# Patient Record
Sex: Female | Born: 1965 | Race: White | Hispanic: No | Marital: Married
Health system: Southern US, Community
[De-identification: ages and names within clinical notes are randomized; demographics above are authoritative.]

---

## 1997-06-11 ENCOUNTER — Inpatient Hospital Stay (HOSPITAL_COMMUNITY): Admission: AD | Admit: 1997-06-11 | Discharge: 1997-06-12 | Payer: Self-pay | Admitting: Obstetrics & Gynecology

## 1997-06-29 ENCOUNTER — Inpatient Hospital Stay (HOSPITAL_COMMUNITY): Admission: AD | Admit: 1997-06-29 | Discharge: 1997-07-01 | Payer: Self-pay | Admitting: Obstetrics & Gynecology

## 1997-07-08 ENCOUNTER — Encounter: Admission: RE | Admit: 1997-07-08 | Discharge: 1997-10-06 | Payer: Self-pay | Admitting: Obstetrics and Gynecology

## 1997-08-16 ENCOUNTER — Inpatient Hospital Stay (HOSPITAL_COMMUNITY): Admission: AD | Admit: 1997-08-16 | Discharge: 1997-08-21 | Payer: Self-pay | Admitting: Obstetrics and Gynecology

## 1997-08-21 ENCOUNTER — Encounter: Admission: RE | Admit: 1997-08-21 | Discharge: 1997-11-19 | Payer: Self-pay | Admitting: Obstetrics and Gynecology

## 1997-11-20 ENCOUNTER — Encounter (HOSPITAL_COMMUNITY): Admission: RE | Admit: 1997-11-20 | Discharge: 1997-12-14 | Payer: Self-pay | Admitting: *Deleted

## 1998-04-09 ENCOUNTER — Other Ambulatory Visit: Admission: RE | Admit: 1998-04-09 | Discharge: 1998-04-09 | Payer: Self-pay | Admitting: Obstetrics and Gynecology

## 1998-05-28 ENCOUNTER — Ambulatory Visit (HOSPITAL_COMMUNITY): Admission: RE | Admit: 1998-05-28 | Discharge: 1998-05-28 | Payer: Self-pay | Admitting: Obstetrics and Gynecology

## 1999-04-18 ENCOUNTER — Other Ambulatory Visit: Admission: RE | Admit: 1999-04-18 | Discharge: 1999-04-18 | Payer: Self-pay | Admitting: Obstetrics and Gynecology

## 2000-04-03 ENCOUNTER — Other Ambulatory Visit: Admission: RE | Admit: 2000-04-03 | Discharge: 2000-04-03 | Payer: Self-pay | Admitting: Obstetrics and Gynecology

## 2000-06-21 ENCOUNTER — Encounter (INDEPENDENT_AMBULATORY_CARE_PROVIDER_SITE_OTHER): Payer: Self-pay | Admitting: Specialist

## 2000-06-21 ENCOUNTER — Other Ambulatory Visit: Admission: RE | Admit: 2000-06-21 | Discharge: 2000-06-21 | Payer: Self-pay | Admitting: Obstetrics and Gynecology

## 2001-06-28 ENCOUNTER — Other Ambulatory Visit: Admission: RE | Admit: 2001-06-28 | Discharge: 2001-06-28 | Payer: Self-pay | Admitting: Obstetrics and Gynecology

## 2002-06-30 ENCOUNTER — Other Ambulatory Visit: Admission: RE | Admit: 2002-06-30 | Discharge: 2002-06-30 | Payer: Self-pay | Admitting: Obstetrics and Gynecology

## 2002-12-29 ENCOUNTER — Inpatient Hospital Stay (HOSPITAL_COMMUNITY): Admission: AD | Admit: 2002-12-29 | Discharge: 2003-01-01 | Payer: Self-pay | Admitting: Obstetrics and Gynecology

## 2003-02-03 ENCOUNTER — Other Ambulatory Visit: Admission: RE | Admit: 2003-02-03 | Discharge: 2003-02-03 | Payer: Self-pay | Admitting: Obstetrics and Gynecology

## 2005-02-27 ENCOUNTER — Other Ambulatory Visit: Admission: RE | Admit: 2005-02-27 | Discharge: 2005-02-27 | Payer: Self-pay | Admitting: Obstetrics and Gynecology

## 2008-07-11 ENCOUNTER — Encounter: Payer: Self-pay | Admitting: Emergency Medicine

## 2008-07-11 ENCOUNTER — Observation Stay (HOSPITAL_COMMUNITY): Admission: EM | Admit: 2008-07-11 | Discharge: 2008-07-12 | Payer: Self-pay | Admitting: Orthopedic Surgery

## 2008-08-24 ENCOUNTER — Ambulatory Visit: Payer: Self-pay | Admitting: Vascular Surgery

## 2008-08-24 ENCOUNTER — Ambulatory Visit: Admission: RE | Admit: 2008-08-24 | Discharge: 2008-08-24 | Payer: Self-pay | Admitting: Orthopedic Surgery

## 2008-08-24 ENCOUNTER — Encounter (INDEPENDENT_AMBULATORY_CARE_PROVIDER_SITE_OTHER): Payer: Self-pay | Admitting: Orthopedic Surgery

## 2010-08-11 LAB — COMPREHENSIVE METABOLIC PANEL
Alkaline Phosphatase: 114 U/L (ref 39–117)
BUN: 16 mg/dL (ref 6–23)
Chloride: 107 mEq/L (ref 96–112)
Creatinine, Ser: 0.78 mg/dL (ref 0.4–1.2)
Glucose, Bld: 107 mg/dL — ABNORMAL HIGH (ref 70–99)
Potassium: 3.5 mEq/L (ref 3.5–5.1)
Total Bilirubin: 0.7 mg/dL (ref 0.3–1.2)

## 2010-08-11 LAB — DIFFERENTIAL
Basophils Absolute: 0 10*3/uL (ref 0.0–0.1)
Basophils Relative: 0 % (ref 0–1)
Lymphocytes Relative: 20 % (ref 12–46)
Neutro Abs: 5.4 10*3/uL (ref 1.7–7.7)
Neutrophils Relative %: 74 % (ref 43–77)

## 2010-08-11 LAB — CBC
HCT: 38 % (ref 36.0–46.0)
Hemoglobin: 13 g/dL (ref 12.0–15.0)
MCV: 89.8 fL (ref 78.0–100.0)
RDW: 11.9 % (ref 11.5–15.5)
WBC: 7.3 10*3/uL (ref 4.0–10.5)

## 2010-09-13 NOTE — Op Note (Signed)
NAMESHARMIN, FOULK                    ACCOUNT NO.:  [id number]   MEDICAL RECORD NO.:  [id number]          PATIENT TYPE:  INP   LOCATION:  5033                         FACILITY:  [facility]   PHYSICIAN:  Nadara Mustard, MD     DATE OF BIRTH:  09-Oct-1965   DATE OF PROCEDURE:  DATE OF DISCHARGE:                               OPERATIVE REPORT   PREOPERATIVE DIAGNOSIS:  Closed spiral right tib-fib fracture.   POSTOPERATIVE DIAGNOSIS:  Closed spiral right tib-fib fracture.   PROCEDURE:  Intramedullary nailing of right tibia with a Synthes 10 x  340 mm nail, with a 40 mm proximal interlocking screw.   SURGEON:  Nadara Mustard, MD   ANESTHESIA:  General.   ESTIMATED BLOOD LOSS:  Minimal.   ANTIBIOTICS:  1 g of Kefzol.   DRAINS:  None.   COMPLICATIONS:  None.   TOURNIQUET TIME:  None.   DISPOSITION:  To PACU in stable condition.   INDICATIONS FOR PROCEDURE:  The patient is a 45 year old woman who fell  while road skating sustaining a spiral fracture of the tibia at the  junction of the middle and distal third.  She also had a proximal  fracture of the fibula.  She was initially brought to [facility]  Emergency Room.  She was neurovascularly intact with obvious deformity  to her leg.  Radiographs confirmed the tib-fib fracture.  [place]  operating room state that they were not available for surgery and the  patient was brought to [facility] for definitive treatment for the  closed tib-fib fracture.  Risks and benefits were discussed with the  patient and her husband including infection, neurovascular injury,  persistent pain, DVT, pulmonary embolus, need for additional surgery.  The patient states she understands and wished to proceed at this time.   DESCRIPTION OF PROCEDURE:  The patient was brought to OR, room 15, and  underwent a general anesthetic.  After adequate level of anesthesia  obtained, the patient's right lower extremity was prepped using DuraPrep  and draped into a  sterile field.  A medial parapatellar incision was  made approximately 2 cm in length.  A blunt dissection was carried down  to the tibial plateau.  A guidewire was inserted into the starting point  of the tibial plateau and this was verified by C-arm fluoroscopy in both  AP and lateral plains.  The guidewire was advanced and this was  overdrilled with a reamer with a tissue protector in place.  The  guidewire was then inserted with the fracture reduced.  The guidewire  was inserted across the fracture site.  This was then sequentially  reamed from 8.5 to 11.5 mm for a 10 mm nail.  The 10 mm nail was then  advanced across the guidewire with the fracture reduced.  C-arm  fluoroscopy verified reduction.  The spiral fracture lock rotationally  in place and the distal interlocking screw was not used.  Proximal  interlocking guide was used and a 40-mm proximal interlocking screw was  placed.  The instruments were removed.  The C-arm  fluoroscopy verified  reduction in both AP and lateral plains.  The subcu was closed using 2-0  Vicryl and skin was closed using Proximate staples.  The wound was  covered with Adaptic orthopedic sponges, sterile Kerlix, and Coban.  The  patient was extubated and taken to the PACU in stable condition.      Nadara Mustard, MD  Electronically Signed     MVD/MEDQ  D:  07/11/2008  T:  07/12/2008  Job:  [id number]

## 2010-11-23 ENCOUNTER — Encounter (HOSPITAL_COMMUNITY)
Admission: RE | Admit: 2010-11-23 | Discharge: 2010-11-23 | Disposition: A | Discharge status: Home / Self Care | Payer: BC Managed Care – PPO | Source: Ambulatory Visit | Attending: Orthopedic Surgery | Admitting: Orthopedic Surgery

## 2010-11-23 LAB — CBC
Hemoglobin: 14 g/dL (ref 12.0–15.0)
MCHC: 35 g/dL (ref 30.0–36.0)
RBC: 4.63 MIL/uL (ref 3.87–5.11)
WBC: 6.2 10*3/uL (ref 4.0–10.5)

## 2010-11-23 LAB — SURGICAL PCR SCREEN
MRSA, PCR: NEGATIVE
Staphylococcus aureus: NEGATIVE

## 2010-11-23 LAB — HCG, SERUM, QUALITATIVE: Preg, Serum: NEGATIVE

## 2010-11-23 LAB — ABO/RH: ABO/RH(D): AB POS

## 2010-11-24 LAB — TYPE AND SCREEN
ABO/RH(D): AB POS
Antibody Screen: NEGATIVE

## 2010-11-28 ENCOUNTER — Ambulatory Visit (HOSPITAL_COMMUNITY)
Admission: RE | Admit: 2010-11-28 | Discharge: 2010-11-28 | Disposition: A | Discharge status: Home / Self Care | Payer: BC Managed Care – PPO | Source: Ambulatory Visit | Attending: Orthopedic Surgery | Admitting: Orthopedic Surgery

## 2010-11-28 ENCOUNTER — Ambulatory Visit (HOSPITAL_COMMUNITY): Payer: BC Managed Care – PPO

## 2010-11-28 DIAGNOSIS — Z01812 Encounter for preprocedural laboratory examination: Secondary | ICD-10-CM | POA: Insufficient documentation

## 2010-11-28 DIAGNOSIS — Z472 Encounter for removal of internal fixation device: Secondary | ICD-10-CM | POA: Insufficient documentation

## 2010-11-28 DIAGNOSIS — M8560 Other cyst of bone, unspecified site: Secondary | ICD-10-CM | POA: Insufficient documentation

## 2010-12-16 NOTE — Op Note (Addendum)
  Jennifer Liu, Jennifer Liu                    ACCOUNT NO.:  [id number]  MEDICAL RECORD NO.:  [id number]  LOCATION:  SDS                          FACILITY:  [facility]  PHYSICIAN:  Mila Homer. Sherlean Foot, M.D. DATE OF BIRTH:  January 14, 1966  DATE OF PROCEDURE:  11/28/2010 DATE OF DISCHARGE:  11/23/2010                              OPERATIVE REPORT   SURGEON:  Mila Homer. Sherlean Foot, MD  ASSISTANT:  Altamese Cabal, PA-C  ANESTHESIA:  General.  PREOPERATIVE DIAGNOSIS:  Painful right tibial nail.  POSTOPERATIVE DIAGNOSIS:  Painful right tibial nail.  PROCEDURE:  Attempted right IM nail removal and bone grafting of open patellar cyst.  INDICATIONS FOR PROCEDURE:  The patient is over a year out from intramedullary nailing and dynamization of the nail with complete healing but continued pain in the tibia to failure conservative measures.  Informed consent was obtained.  DESCRIPTION OF PROCEDURE:  The patient was laid supine and administered general anesthesia.  Right leg prepped and draped in usual sterile fashion.  The extremity was exsanguinated with the Esmarch and tourniquet inflated to 350 mmHg.  I then made the incision through the old incision with a #10 blade and extended down to a centimeter distally to obtain excellent exposure.  I went down through the knee capsule, removed some of the fat that become an entry point of the tibia without a problem.  I then rongeured out the hole, there was a cyst measuring approximately 3 x 3 cm in the capsule area.  I felt that this could have been pathologic and could have been the cause of some of the patient's pains.  The nail was quite deep in the tibia.  I did clear out the hole and threads and attached the extraction device.  I attempted to extract it  and could not move it at all.  I felt that there was probably bone bridging across the screw holes.  After several attempts, I decided to abort that for fear of causing damage to bone.  I then used 20 mL  of crushed cancellous graft and grafted the tibial cyst.  I then irrigating closed with buried 0 Vicryl sutures and Monocryl.  I then dressed with Xeroform dressing, sponges, sterile Webril and Ace wrap.  COMPLICATIONS:  None.  DRAINS:  None.  ESTIMATED BLOOD LOSS:  Minimal.          ______________________________ Mila Homer. Sherlean Foot, M.D.     SDL/MEDQ  D:  11/30/2010  T:  11/30/2010  Job:  [id number]  Electronically Signed by Georgena Spurling M.D. on 12/16/2010 02:38:58 PM

## 2018-09-20 ENCOUNTER — Other Ambulatory Visit: Payer: Self-pay | Admitting: Obstetrics and Gynecology

## 2018-09-20 DIAGNOSIS — R928 Other abnormal and inconclusive findings on diagnostic imaging of breast: Secondary | ICD-10-CM

## 2018-09-30 ENCOUNTER — Other Ambulatory Visit: Payer: Self-pay

## 2018-09-30 ENCOUNTER — Ambulatory Visit
Admission: RE | Admit: 2018-09-30 | Discharge: 2018-09-30 | Disposition: A | Discharge status: Home / Self Care | Payer: BC Managed Care – PPO | Source: Ambulatory Visit | Attending: Obstetrics and Gynecology | Admitting: Obstetrics and Gynecology

## 2018-09-30 DIAGNOSIS — R928 Other abnormal and inconclusive findings on diagnostic imaging of breast: Secondary | ICD-10-CM

## 2019-07-17 ENCOUNTER — Ambulatory Visit: Payer: BC Managed Care – PPO | Attending: Internal Medicine

## 2019-07-17 DIAGNOSIS — Z23 Encounter for immunization: Secondary | ICD-10-CM

## 2019-07-17 NOTE — Progress Notes (Signed)
   Covid-19 Vaccination Clinic  Name:  Acsa Estey    MRN: [id number] DOB: November 23, 1965  07/17/2019  Ms. Cozzolino was observed post Covid-19 immunization for 15 minutes without incident. She was provided with Vaccine Information Sheet and instruction to access the V-Safe system.   Ms. Balgobin was instructed to call 911 with any severe reactions post vaccine: Marland Kitchen Difficulty breathing  . Swelling of face and throat  . A fast heartbeat  . A bad rash all over body  . Dizziness and weakness   Immunizations Administered    Name Date Dose VIS Date Route   Pfizer COVID-19 Vaccine 07/17/2019  4:21 PM 0.3 mL 04/11/2019 Intramuscular   Manufacturer: [employer], [employer]   Lot: [id number]   NDC: [address]-0

## 2019-08-13 ENCOUNTER — Ambulatory Visit: Payer: BC Managed Care – PPO | Attending: Internal Medicine

## 2019-08-13 DIAGNOSIS — Z23 Encounter for immunization: Secondary | ICD-10-CM

## 2019-08-13 NOTE — Progress Notes (Signed)
   Covid-19 Vaccination Clinic  Name:  Tamantha Saline    MRN: [id number] DOB: [birth year]  08/13/2019  Ms. Wassenaar was observed post Covid-19 immunization for 15 minutes without incident. She was provided with Vaccine Information Sheet and instruction to access the V-Safe system.   Ms. Rindfleisch was instructed to call 911 with any severe reactions post vaccine: Marland Kitchen Difficulty breathing  . Swelling of face and throat  . A fast heartbeat  . A bad rash all over body  . Dizziness and weakness   Immunizations Administered    Name Date Dose VIS Date Route   Pfizer COVID-19 Vaccine 08/13/2019  3:54 PM 0.3 mL 04/11/2019 Intramuscular   Manufacturer: [employer], [employer]   Lot: [id number]   NDC: [address]-8

## 2020-06-26 IMAGING — MG DIGITAL DIAGNOSTIC UNILATERAL LEFT MAMMOGRAM WITH TOMO AND CAD
4 series · 4 of 12 positions shown · non-contrast
Comparison: Previous exam(s).

CLINICAL DATA: Possible mass in the posterior aspect of the lateral
left breast in the craniocaudal projection of a recent screening
mammogram.

EXAM:
DIGITAL DIAGNOSTIC LEFT MAMMOGRAM WITH CAD AND TOMO
ULTRASOUND LEFT BREAST

[L CC synth-2D]
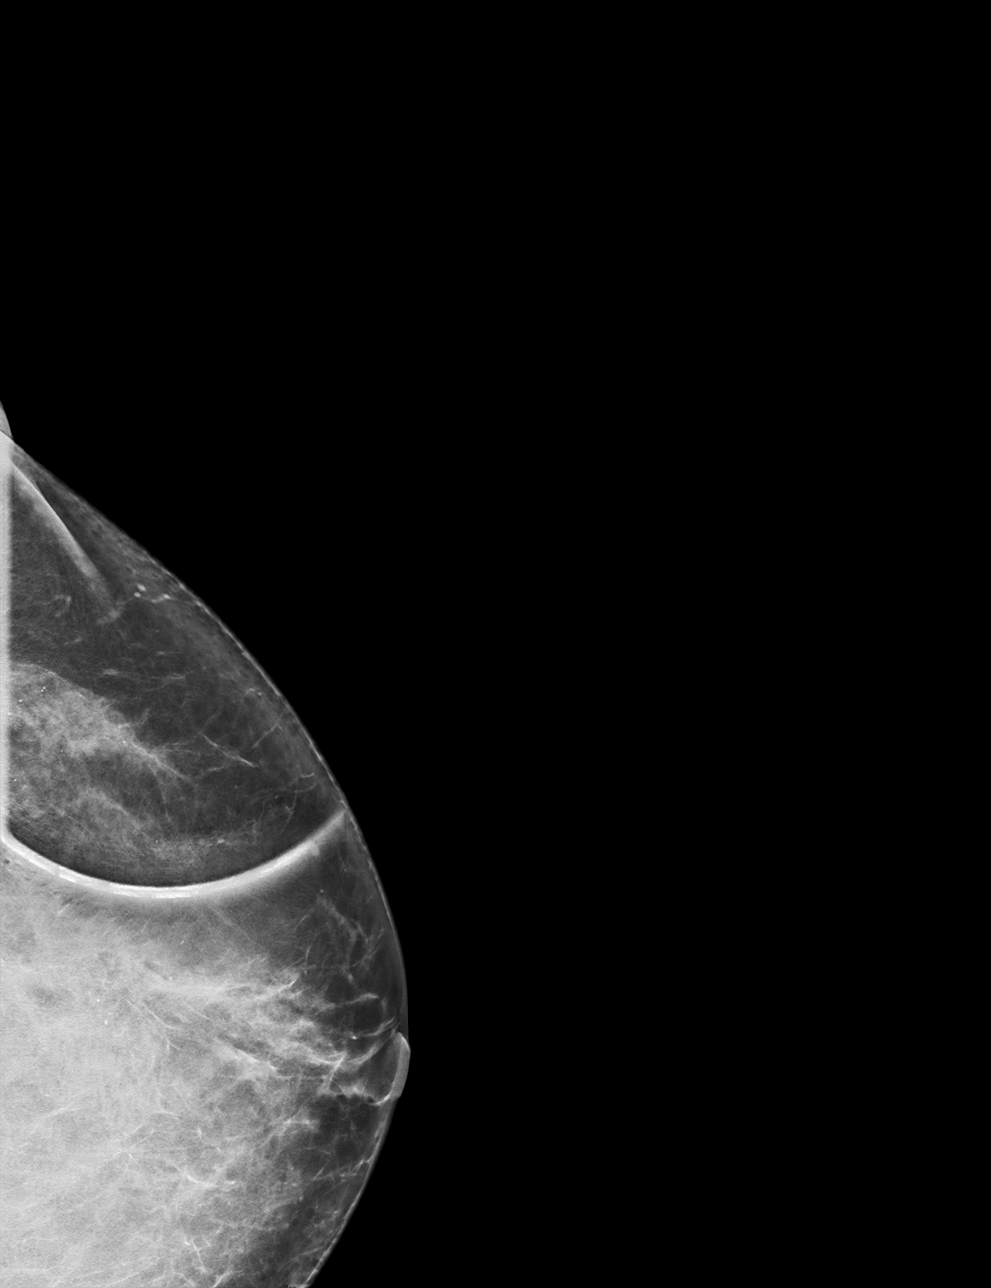

[L ML synth-2D]
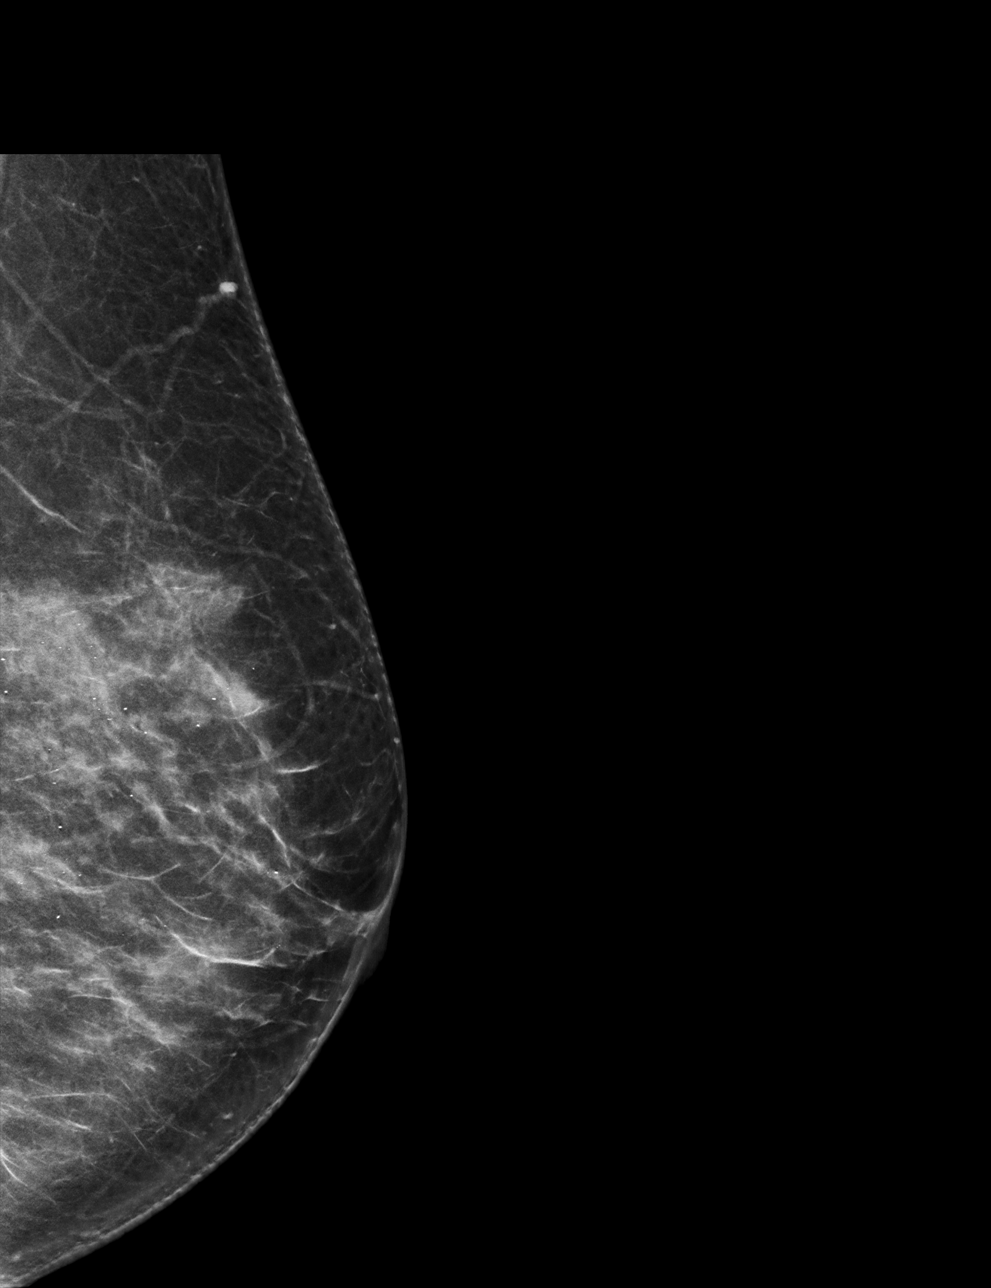

[L ML tomo · tomo slice 36/71.0]
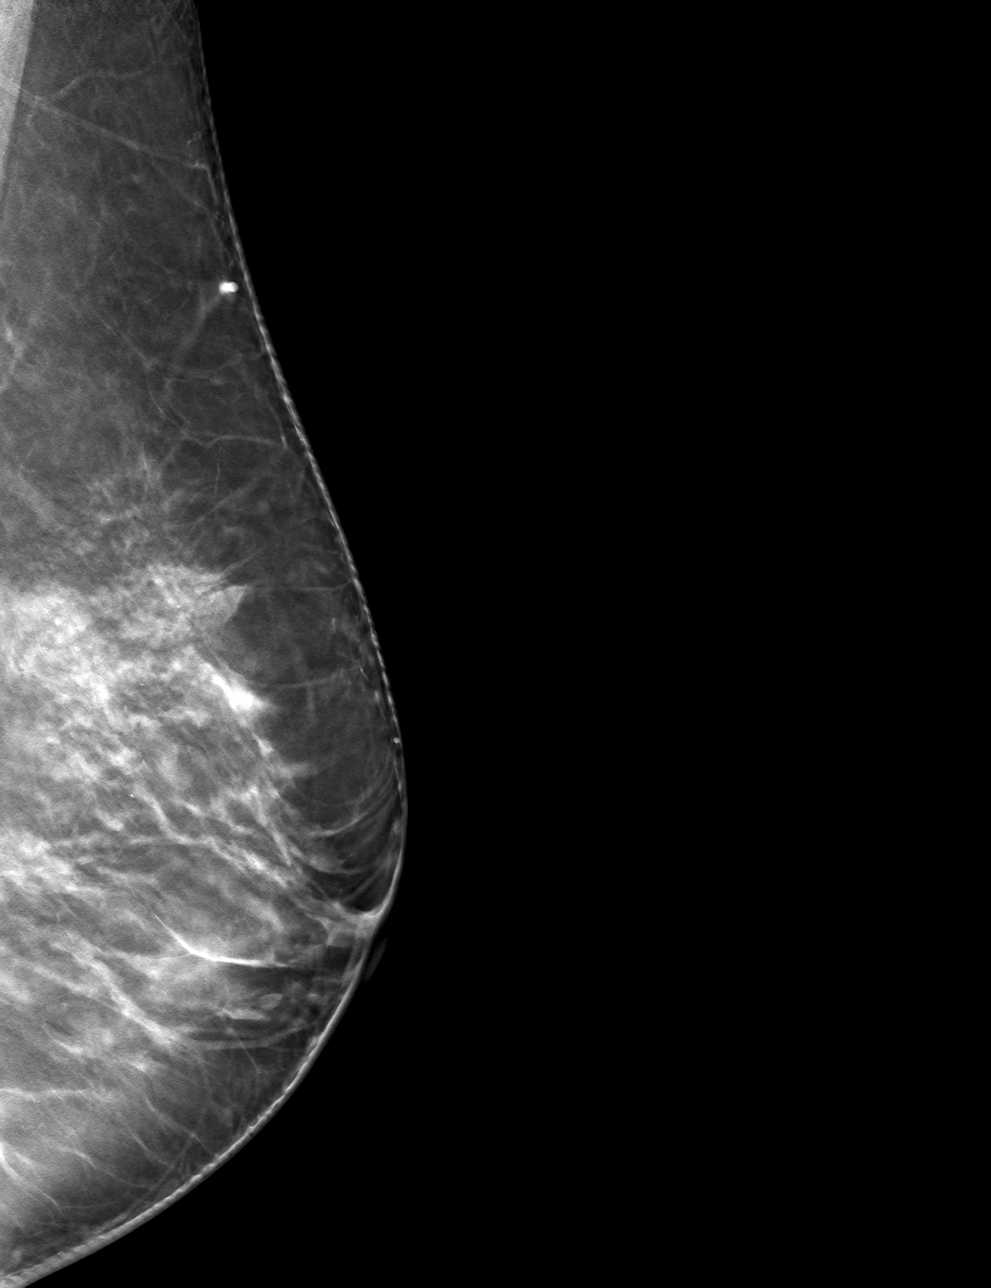

[L CC tomo · tomo slice 33/66.0]
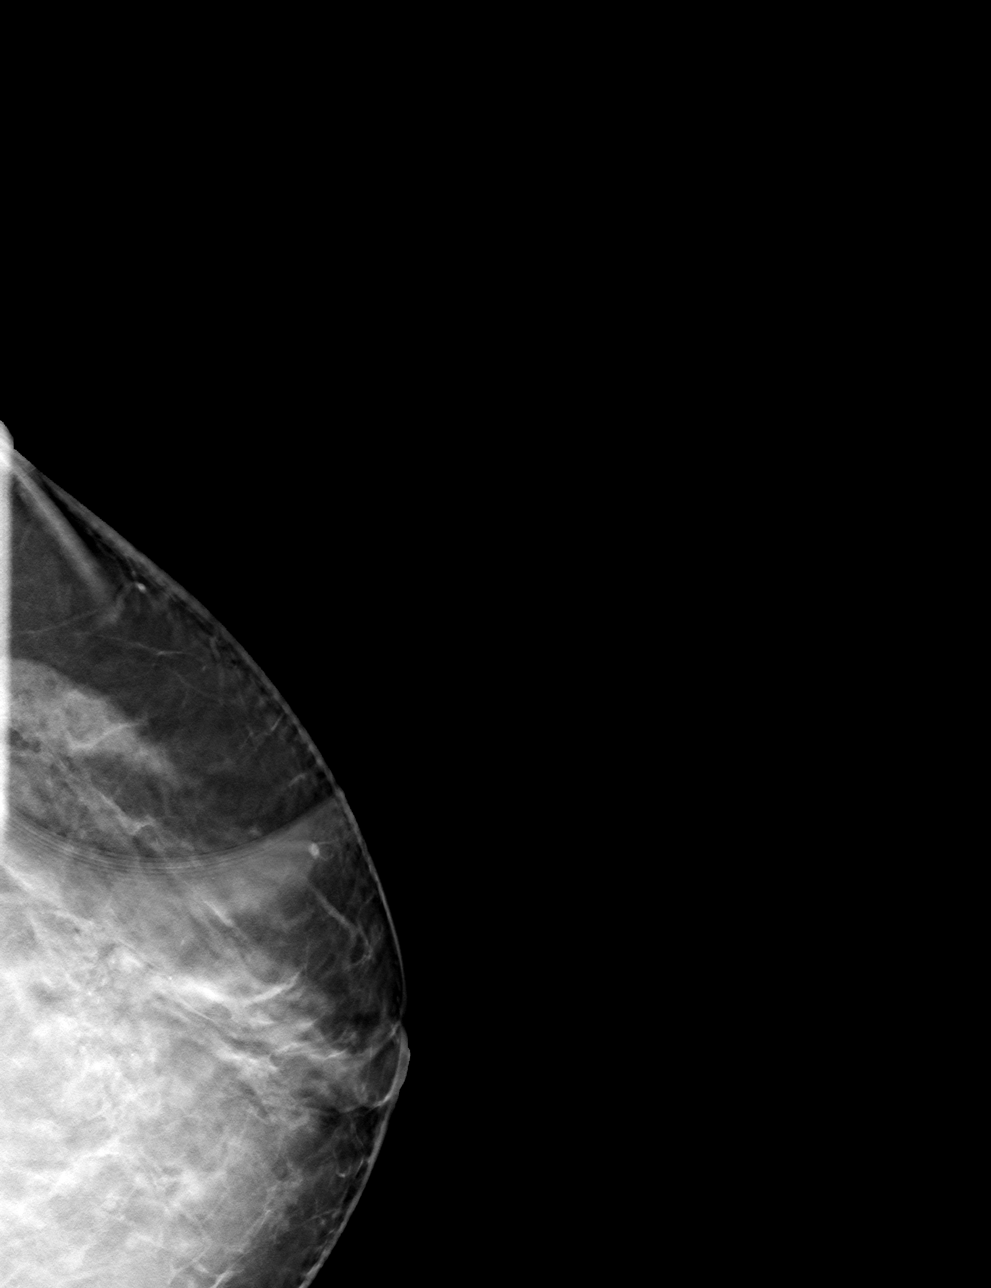

[4 of 12 positions shown; findings below may reference images not displayed]

ACR Breast Density Category c: The breast tissue is heterogeneously
dense, which may obscure small masses.
FINDINGS: 3D tomographic and 2D generated true lateral and spot compression
craniocaudal views of the left breast were obtained. These
demonstrate normal appearing dense glandular tissue at the location
of the recently suspected mass the posterior aspect of the lateral
portion of the breast.

Mammographic images were processed with CAD.

Targeted ultrasound is performed, showing a 1.1 cm cluster of cysts
in the 2 o'clock position of the left breast, 6 cm from the nipple.
This corresponds to the recently suspected mammographic mass.
IMPRESSION: Benign left breast cysts.  No evidence of malignancy.

RECOMMENDATION:
Bilateral screening mammogram in 1 year.

I have discussed the findings and recommendations with the patient.
Results were also provided in writing at the conclusion of the
visit. If applicable, a reminder letter will be sent to the patient
regarding the next appointment.

BI-RADS CATEGORY  2: Benign.

## 2020-06-26 IMAGING — US ULTRASOUND LEFT BREAST LIMITED
1 series · 12 of 12 positions shown · non-contrast
Comparison: Previous exam(s).

CLINICAL DATA: Possible mass in the posterior aspect of the lateral
left breast in the craniocaudal projection of a recent screening
mammogram.

EXAM:
DIGITAL DIAGNOSTIC LEFT MAMMOGRAM WITH CAD AND TOMO
ULTRASOUND LEFT BREAST

[Series 1: ultrasound left breast limited · 0.06mm/px · 12 of 12 slices shown]
[im 1/12]
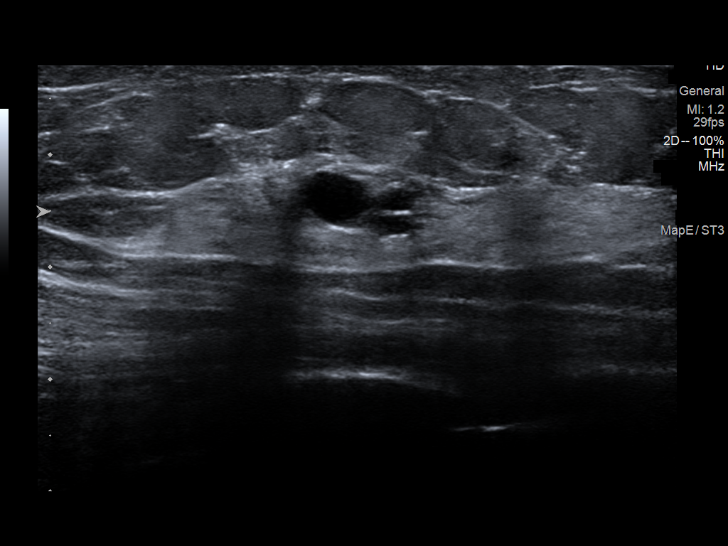
[im 2/12]
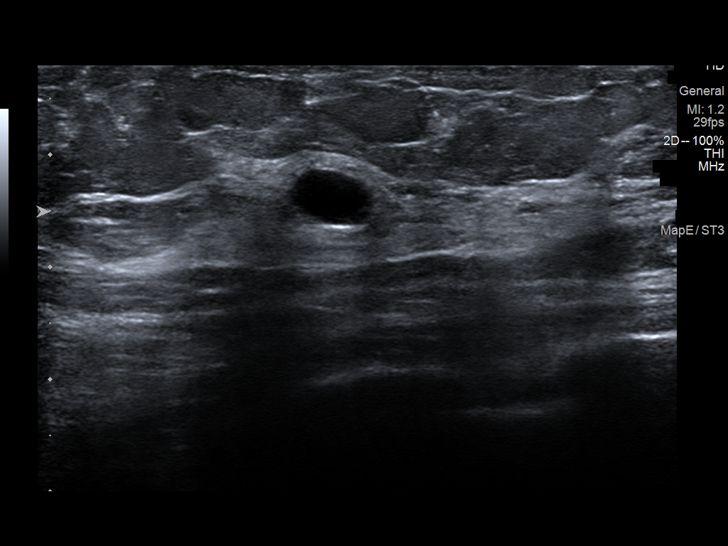
[im 3/12]
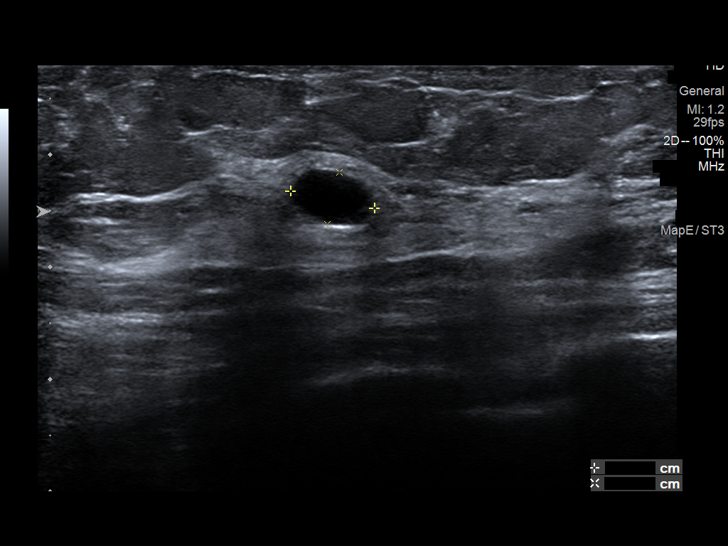
[im 4/12]
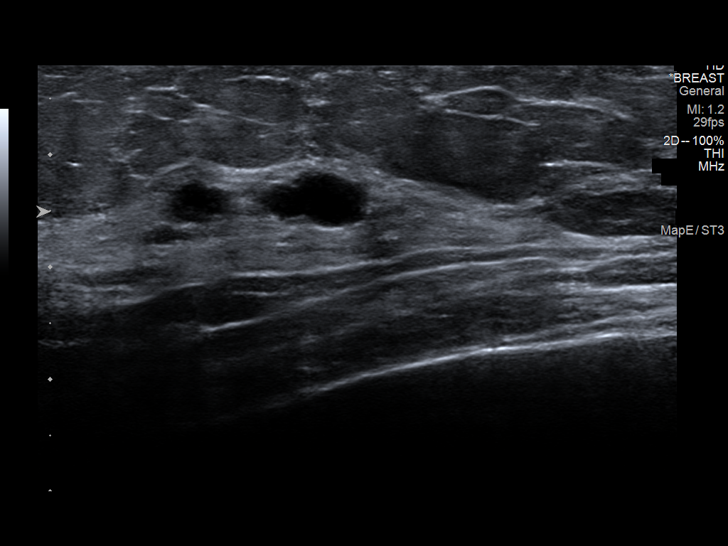
[im 5/12]
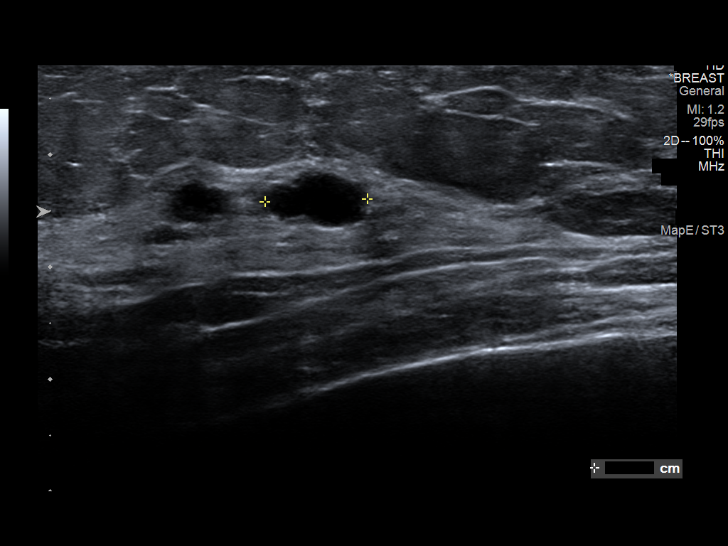
[im 6/12]
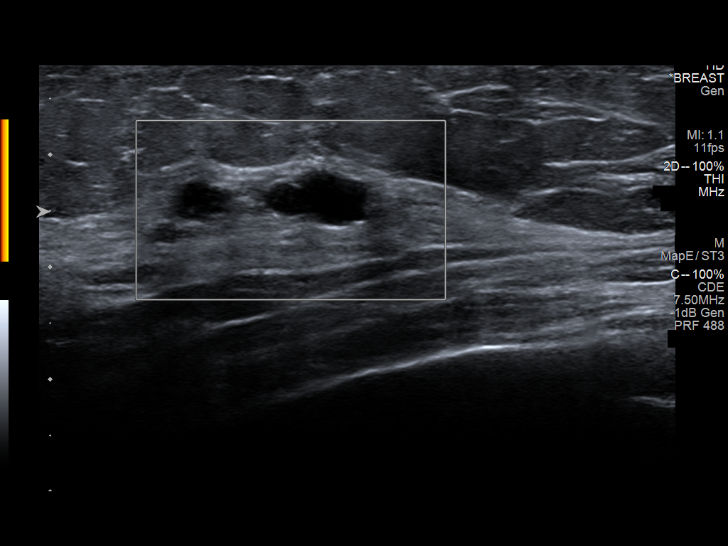
[im 7/12]
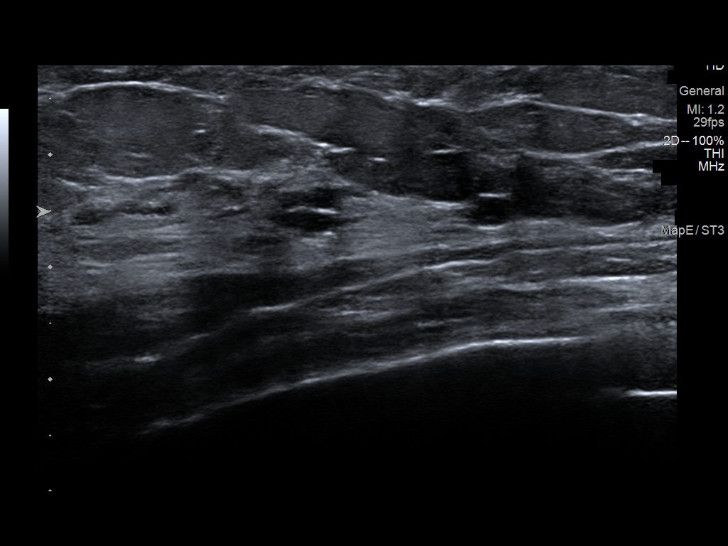
[im 8/12]
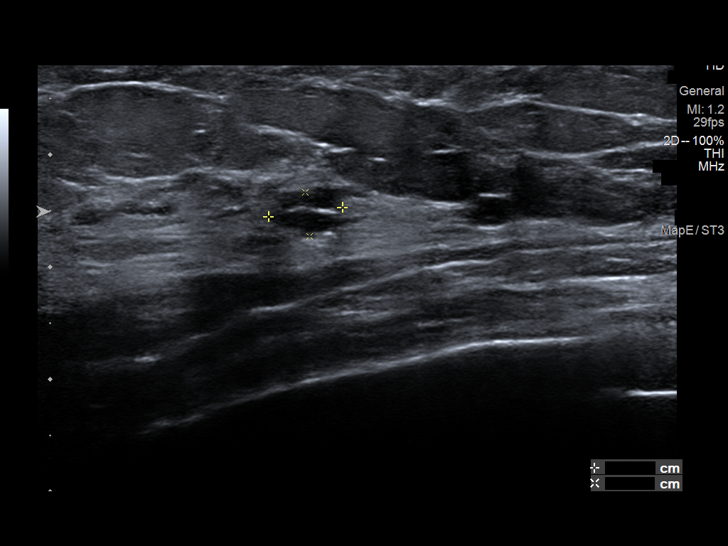
[im 9/12]
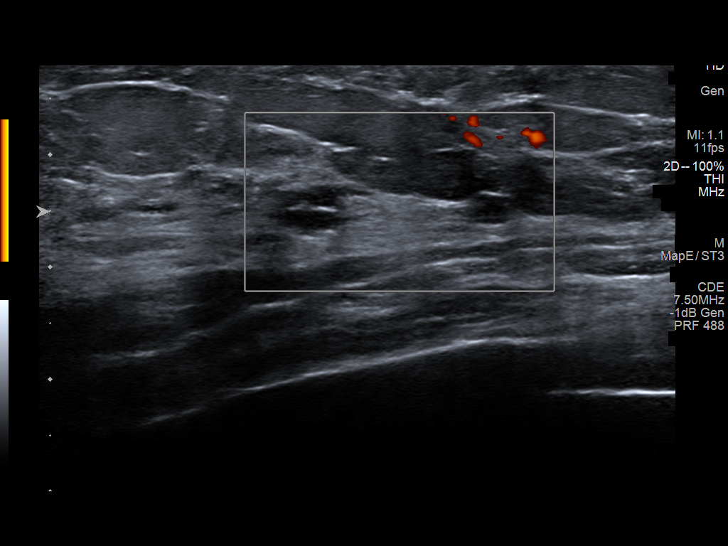
[im 10/12]
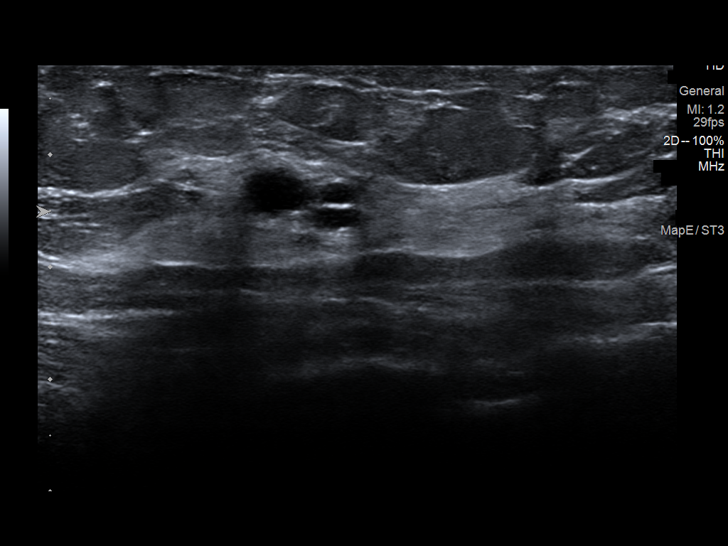
[im 11/12]
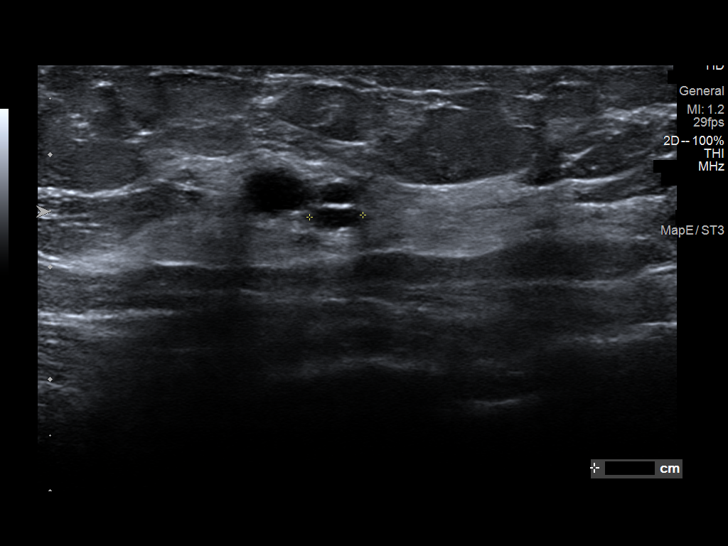
[im 12/12]
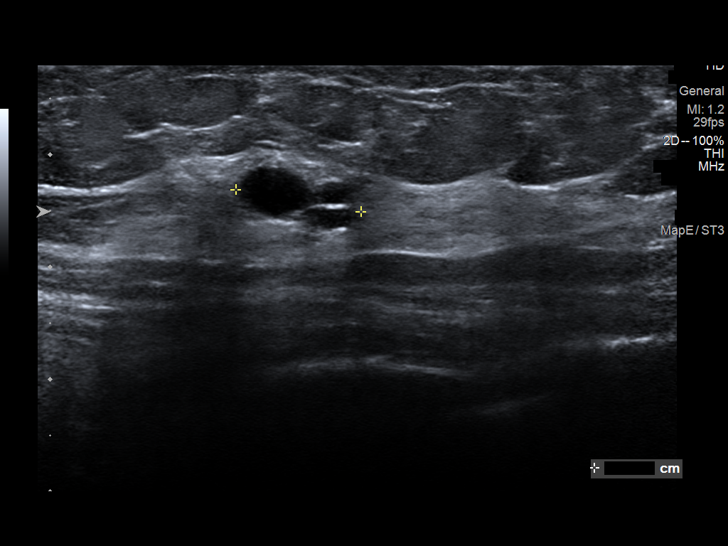

[12 of 12 positions shown; findings below may reference images not displayed]

ACR Breast Density Category c: The breast tissue is heterogeneously
dense, which may obscure small masses.
FINDINGS: 3D tomographic and 2D generated true lateral and spot compression
craniocaudal views of the left breast were obtained. These
demonstrate normal appearing dense glandular tissue at the location
of the recently suspected mass the posterior aspect of the lateral
portion of the breast.

Mammographic images were processed with CAD.

Targeted ultrasound is performed, showing a 1.1 cm cluster of cysts
in the 2 o'clock position of the left breast, 6 cm from the nipple.
This corresponds to the recently suspected mammographic mass.
IMPRESSION: Benign left breast cysts.  No evidence of malignancy.

RECOMMENDATION:
Bilateral screening mammogram in 1 year.

I have discussed the findings and recommendations with the patient.
Results were also provided in writing at the conclusion of the
visit. If applicable, a reminder letter will be sent to the patient
regarding the next appointment.

BI-RADS CATEGORY  2: Benign.
